# Patient Record
Sex: Female | Born: 1965 | Race: Black or African American | Hispanic: No | Marital: Single | Smoking: Current every day smoker
Health system: Southern US, Community
[De-identification: ages and names within clinical notes are randomized; demographics above are authoritative.]

## PROBLEM LIST (undated history)

## (undated) DIAGNOSIS — H409 Unspecified glaucoma: Secondary | ICD-10-CM

## (undated) DIAGNOSIS — M1990 Unspecified osteoarthritis, unspecified site: Secondary | ICD-10-CM

## (undated) DIAGNOSIS — T7840XA Allergy, unspecified, initial encounter: Secondary | ICD-10-CM

## (undated) DIAGNOSIS — Z973 Presence of spectacles and contact lenses: Secondary | ICD-10-CM

## (undated) HISTORY — DX: Allergy, unspecified, initial encounter: T78.40XA

## (undated) HISTORY — PX: BREAST EXCISIONAL BIOPSY: SUR124

## (undated) HISTORY — DX: Unspecified glaucoma: H40.9

## (undated) HISTORY — PX: DILATION AND CURETTAGE OF UTERUS: SHX78

## (undated) HISTORY — DX: Unspecified osteoarthritis, unspecified site: M19.90

---

## 1994-06-09 HISTORY — PX: CERVICAL DISC SURGERY: SHX588

## 1997-11-14 ENCOUNTER — Observation Stay (HOSPITAL_COMMUNITY): Admission: RE | Admit: 1997-11-14 | Discharge: 1997-11-15 | Payer: Self-pay | Admitting: Neurosurgery

## 1997-12-12 ENCOUNTER — Ambulatory Visit (HOSPITAL_COMMUNITY): Admission: RE | Admit: 1997-12-12 | Discharge: 1997-12-12 | Payer: Self-pay | Admitting: Neurosurgery

## 1998-01-02 ENCOUNTER — Ambulatory Visit (HOSPITAL_COMMUNITY): Admission: RE | Admit: 1998-01-02 | Discharge: 1998-01-02 | Payer: Self-pay | Admitting: *Deleted

## 1999-01-01 ENCOUNTER — Other Ambulatory Visit: Admission: RE | Admit: 1999-01-01 | Discharge: 1999-01-01 | Payer: Self-pay | Admitting: Obstetrics and Gynecology

## 2000-12-07 ENCOUNTER — Other Ambulatory Visit: Admission: RE | Admit: 2000-12-07 | Discharge: 2000-12-07 | Payer: Self-pay | Admitting: Obstetrics and Gynecology

## 2003-11-10 ENCOUNTER — Emergency Department (HOSPITAL_COMMUNITY): Admission: EM | Admit: 2003-11-10 | Discharge: 2003-11-10 | Payer: Self-pay | Admitting: Emergency Medicine

## 2006-01-15 ENCOUNTER — Other Ambulatory Visit: Admission: RE | Admit: 2006-01-15 | Discharge: 2006-01-15 | Payer: Self-pay | Admitting: *Deleted

## 2006-09-03 ENCOUNTER — Emergency Department (HOSPITAL_COMMUNITY): Admission: EM | Admit: 2006-09-03 | Discharge: 2006-09-03 | Payer: Self-pay | Admitting: Family Medicine

## 2007-05-10 ENCOUNTER — Other Ambulatory Visit: Admission: RE | Admit: 2007-05-10 | Discharge: 2007-05-10 | Payer: Self-pay | Admitting: *Deleted

## 2007-05-15 ENCOUNTER — Emergency Department (HOSPITAL_COMMUNITY): Admission: EM | Admit: 2007-05-15 | Discharge: 2007-05-15 | Payer: Self-pay | Admitting: Family Medicine

## 2008-05-30 ENCOUNTER — Other Ambulatory Visit: Admission: RE | Admit: 2008-05-30 | Discharge: 2008-05-30 | Payer: Self-pay | Admitting: Gynecology

## 2008-06-27 ENCOUNTER — Other Ambulatory Visit: Admission: RE | Admit: 2008-06-27 | Discharge: 2008-06-27 | Payer: Self-pay | Admitting: Gynecology

## 2011-05-19 ENCOUNTER — Encounter: Payer: Self-pay | Admitting: Emergency Medicine

## 2011-05-19 ENCOUNTER — Emergency Department (HOSPITAL_COMMUNITY)
Admission: EM | Admit: 2011-05-19 | Discharge: 2011-05-19 | Disposition: A | Discharge status: Home / Self Care | Payer: Self-pay | Source: Home / Self Care | Attending: Family Medicine | Admitting: Family Medicine

## 2011-05-19 DIAGNOSIS — J069 Acute upper respiratory infection, unspecified: Secondary | ICD-10-CM

## 2011-05-19 DIAGNOSIS — R05 Cough: Secondary | ICD-10-CM

## 2011-05-19 MED ORDER — GUAIFENESIN-CODEINE 100-10 MG/5ML PO SYRP: 5.0000 mL | ORAL_SOLUTION | Freq: Four times a day (QID) | ORAL | Status: AC | PRN

## 2011-05-19 MED ORDER — AZITHROMYCIN 250 MG PO TABS: 250.0000 mg | ORAL_TABLET | Freq: Every day | ORAL | Status: AC

## 2011-05-19 NOTE — ED Provider Notes (Signed)
History     CSN: [id number] Arrival date & time: 05/19/2011 11:19 AM   First MD Initiated Contact with Patient 05/19/11 1141      Chief Complaint  Patient presents with  . URI    (Consider location/radiation/quality/duration/timing/severity/associated sxs/prior treatment) HPI Comments: Rebecca Melendez presents for evaluation of fever, chills, cough, chest congestion, and nasal congestion. She reports her daughter as sick contact. She has tried over the counter products without relief. She smokes a pack of cigarettes daily.   Patient is a 45 y.o. female presenting with URI. The history is provided by the patient.  URI The primary symptoms include fever, sore throat and cough. The current episode started 2 days ago. This is a new problem. The problem has not changed since onset. The maximum temperature recorded prior to her arrival was unknown.  The sore throat began more than 2 days ago. The sore throat has been unchanged since its onset. The sore throat is moderate in intensity. The sore throat is not accompanied by trouble swallowing.  The onset of the illness is associated with exposure to sick contacts.    History reviewed. No pertinent past medical history.  History reviewed. No pertinent past surgical history.  History reviewed. No pertinent family history.  History  Substance Use Topics  . Smoking status: Current Everyday Smoker -- 1.0 packs/day  . Smokeless tobacco: Not on file  . Alcohol Use: Yes     socially    OB History    Grav Para Term Preterm Abortions TAB SAB Ect Mult Living                  Review of Systems  Constitutional: Positive for fever.  HENT: Positive for sore throat. Negative for trouble swallowing.   Eyes: Negative.   Respiratory: Positive for cough.   Cardiovascular: Negative.   Gastrointestinal: Negative.   Genitourinary: Negative.   Musculoskeletal: Negative.   Neurological: Negative.     Allergies  Review of patient's allergies indicates  no known allergies.  Home Medications   Current Outpatient Rx  Name Route Sig Dispense Refill  . ASPIRIN EFFERVESCENT 325 MG PO TBEF Oral Take 325 mg by mouth every 6 (six) hours as needed.      Marland Kitchen DEXTROMETHORPHAN HBR 15 MG/5ML PO SYRP Oral Take 10 mLs by mouth 4 (four) times daily as needed.      . AZITHROMYCIN 250 MG PO TABS Oral Take 1 tablet (250 mg total) by mouth daily. Take two tablets on first day, then one tablet each day for four days 6 tablet 0  . GUAIFENESIN-CODEINE 100-10 MG/5ML PO SYRP Oral Take 5 mLs by mouth every 6 (six) hours as needed for cough or congestion. 120 mL 0    BP 157/91  Pulse 112  Temp(Src) 99.8 F (37.7 C) (Oral)  Resp 18  SpO2 97%  LMP 05/05/2011  Physical Exam  Nursing note and vitals reviewed. Constitutional: She is oriented to person, place, and time. She appears well-developed and well-nourished.  HENT:  Head: Normocephalic and atraumatic.  Right Ear: Tympanic membrane and external ear normal.  Left Ear: Tympanic membrane and external ear normal.  Mouth/Throat: Oropharynx is clear and moist.  Eyes: EOM are normal.  Neck: Normal range of motion.  Pulmonary/Chest: Effort normal. She has no wheezes. She has no rales.  Musculoskeletal: Normal range of motion.  Neurological: She is alert and oriented to person, place, and time.  Skin: Skin is warm and dry.  Psychiatric: Her behavior is normal.  ED Course  Procedures (including critical care time)  Labs Reviewed - No data to display No results found.   1. URI (upper respiratory infection)   2. Cough       MDM          Richardo Priest, MD 05/19/11 1304

## 2011-05-19 NOTE — ED Notes (Signed)
Yesterday pt started coughing white mucus, chills, body aches, diarrhea, nausea.

## 2011-05-22 ENCOUNTER — Telehealth (HOSPITAL_COMMUNITY): Payer: Self-pay | Admitting: *Deleted

## 2012-01-27 ENCOUNTER — Ambulatory Visit (INDEPENDENT_AMBULATORY_CARE_PROVIDER_SITE_OTHER): Payer: BC Managed Care – PPO | Admitting: Family Medicine

## 2012-01-27 VITALS — BP 116/88 | HR 69 | Resp 16 | Ht 61.58 in | Wt 123.6 lb

## 2012-01-27 DIAGNOSIS — H1045 Other chronic allergic conjunctivitis: Secondary | ICD-10-CM

## 2012-01-27 DIAGNOSIS — H109 Unspecified conjunctivitis: Secondary | ICD-10-CM

## 2012-01-27 MED ORDER — POLYMYXIN B-TRIMETHOPRIM 10000-0.1 UNIT/ML-% OP SOLN: 1.0000 [drp] | OPHTHALMIC | Status: AC

## 2012-01-27 NOTE — Progress Notes (Signed)
  Subjective:    Patient ID: Rebecca Melendez, female    DOB: 1966-02-08, 46 y.o.   MRN: [id number]  HPI Rebecca Melendez is a 46 y.o. female Redness itching and swelling in corner of L eye.  Just a little bit in r eye.  Tried visine otc.  No relief. No fever.  No congestion, runny nose. No known sick contacts.    Review of Systems  HENT: Negative for congestion and rhinorrhea.   Eyes: Positive for pain (sore in corner of eye. ), discharge and redness. Negative for visual disturbance.  Respiratory: Negative for cough and shortness of breath.        Objective:   Physical Exam  Constitutional: She appears well-developed and well-nourished.  HENT:  Head: Normocephalic and atraumatic.  Right Ear: External ear normal.  Left Ear: External ear normal.  Mouth/Throat: Oropharynx is clear and moist.  Eyes: EOM are normal. Pupils are equal, round, and reactive to light.         Slight erythema inner L eye conjunctiva.  No exudate. R eye wnl.  Skin: Skin is warm and dry. No rash noted.  Psychiatric: She has a normal mood and affect. Her behavior is normal.          Assessment & Plan:

## 2012-01-27 NOTE — Patient Instructions (Addendum)
You can use over the counter refresh eye drops if needed.  Start the antibiotic drops and if not better in 4 to 5 days - recheck.  Ok to use these in the right eye if it also turns red.  Return to the clinic or go to the nearest emergency room if any of your symptoms worsen or new symptoms occur.    Conjunctivitis Conjunctivitis is commonly called "pink eye." Conjunctivitis can be caused by bacterial or viral infection, allergies, or injuries. There is usually redness of the lining of the eye, itching, discomfort, and sometimes discharge. There may be deposits of matter along the eyelids. A viral infection usually causes a watery discharge, while a bacterial infection causes a yellowish, thick discharge. Pink eye is very contagious and spreads by direct contact. You may be given antibiotic eyedrops as part of your treatment. Before using your eye medicine, remove all drainage from the eye by washing gently with warm water and cotton balls. Continue to use the medication until you have awakened 2 mornings in a row without discharge from the eye. Do not rub your eye. This increases the irritation and helps spread infection. Use separate towels from other household members. Wash your hands with soap and water before and after touching your eyes. Use cold compresses to reduce pain and sunglasses to relieve irritation from light. Do not wear contact lenses or wear eye makeup until the infection is gone. SEEK MEDICAL CARE IF:   Your symptoms are not better after 3 days of treatment.   You have increased pain or trouble seeing.   The outer eyelids become very red or swollen.  Document Released: 07/03/2004 Document Revised: 05/15/2011 Document Reviewed: 05/26/2005 [facility] Patient Information 2012 [place], [place].

## 2013-01-26 ENCOUNTER — Other Ambulatory Visit: Payer: Self-pay | Admitting: Gynecology

## 2013-01-26 DIAGNOSIS — R928 Other abnormal and inconclusive findings on diagnostic imaging of breast: Secondary | ICD-10-CM

## 2013-02-09 ENCOUNTER — Ambulatory Visit
Admission: RE | Admit: 2013-02-09 | Discharge: 2013-02-09 | Disposition: A | Discharge status: Home / Self Care | Payer: BC Managed Care – PPO | Source: Ambulatory Visit | Attending: Gynecology | Admitting: Gynecology

## 2013-02-09 DIAGNOSIS — R928 Other abnormal and inconclusive findings on diagnostic imaging of breast: Secondary | ICD-10-CM

## 2013-02-16 ENCOUNTER — Other Ambulatory Visit: Payer: BC Managed Care – PPO

## 2013-08-15 ENCOUNTER — Encounter (HOSPITAL_COMMUNITY): Payer: Self-pay | Admitting: Emergency Medicine

## 2013-08-15 ENCOUNTER — Emergency Department (HOSPITAL_COMMUNITY)
Admission: EM | Admit: 2013-08-15 | Discharge: 2013-08-15 | Disposition: A | Discharge status: Home / Self Care | Payer: BC Managed Care – PPO | Source: Home / Self Care | Attending: Family Medicine | Admitting: Family Medicine

## 2013-08-15 DIAGNOSIS — H109 Unspecified conjunctivitis: Secondary | ICD-10-CM

## 2013-08-15 DIAGNOSIS — D1739 Benign lipomatous neoplasm of skin and subcutaneous tissue of other sites: Secondary | ICD-10-CM

## 2013-08-15 DIAGNOSIS — D1720 Benign lipomatous neoplasm of skin and subcutaneous tissue of unspecified limb: Secondary | ICD-10-CM

## 2013-08-15 MED ORDER — POLYMYXIN B-TRIMETHOPRIM 10000-0.1 UNIT/ML-% OP SOLN: 1.0000 [drp] | Freq: Four times a day (QID) | OPHTHALMIC | Status: DC

## 2013-08-15 NOTE — ED Provider Notes (Signed)
Medical screening examination/treatment/procedure(s) were performed by resident physician or non-physician practitioner and as supervising physician I was immediately available for consultation/collaboration.   Treyson Axel DOUGLAS MD.   Aslee Such D Eulamae Greenstein, MD 08/15/13 2056 

## 2013-08-15 NOTE — ED Notes (Signed)
C/o  Abscess, right axillary.  Tearing, itching, and pain in the corner of the left eye.   Symptoms present x 2 wks.   Pt has tried sinus meds with no relief.   Denies fever and any other symptoms.

## 2013-08-15 NOTE — Discharge Instructions (Signed)
Conjunctivitis Conjunctivitis is commonly called "pink eye." Conjunctivitis can be caused by bacterial or viral infection, allergies, or injuries. There is usually redness of the lining of the eye, itching, discomfort, and sometimes discharge. There may be deposits of matter along the eyelids. A viral infection usually causes a watery discharge, while a bacterial infection causes a yellowish, thick discharge. Pink eye is very contagious and spreads by direct contact. You may be given antibiotic eyedrops as part of your treatment. Before using your eye medicine, remove all drainage from the eye by washing gently with warm water and cotton balls. Continue to use the medication until you have awakened 2 mornings in a row without discharge from the eye. Do not rub your eye. This increases the irritation and helps spread infection. Use separate towels from other household members. Wash your hands with soap and water before and after touching your eyes. Use cold compresses to reduce pain and sunglasses to relieve irritation from light. Do not wear contact lenses or wear eye makeup until the infection is gone. SEEK MEDICAL CARE IF:   Your symptoms are not better after 3 days of treatment.  You have increased pain or trouble seeing.  The outer eyelids become very red or swollen. Document Released: 07/03/2004 Document Revised: 08/18/2011 Document Reviewed: 05/26/2005 [facility] Patient Information 2014 [place].  Lipoma A lipoma is a noncancerous (benign) tumor composed of fat cells. They are usually found under the skin (subcutaneous). A lipoma may occur in any tissue of the body that contains fat. Common areas for lipomas to appear include the back, shoulders, buttocks, and thighs. Lipomas are a very common soft tissue growth. They are soft and grow slowly. Most problems caused by a lipoma depend on where it is growing. DIAGNOSIS  A lipoma can be diagnosed with a physical exam. These tumors rarely  become cancerous, but radiographic studies can help determine this for certain. Studies used may include:  Computerized X-ray scans (CT or CAT scan).  Computerized magnetic scans (MRI). TREATMENT  Small lipomas that are not causing problems may be watched. If a lipoma continues to enlarge or causes problems, removal is often the best treatment. Lipomas can also be removed to improve appearance. Surgery is done to remove the fatty cells and the surrounding capsule. Most often, this is done with medicine that numbs the area (local anesthetic). The removed tissue is examined under a microscope to make sure it is not cancerous. Keep all follow-up appointments with your caregiver. SEEK MEDICAL CARE IF:   The lipoma becomes larger or hard.  The lipoma becomes painful, red, or increasingly swollen. These could be signs of infection or a more serious condition. Document Released: 05/16/2002 Document Revised: 08/18/2011 Document Reviewed: 10/26/2009 [facility] Patient Information 2014 [place], [place].  Please apply warm compresses to left eye four times a day and use eye drops as directed. If symptoms do not improve, please follow up with either opthomologist listed on your discharge paperwork or the opthomologist of your choice.

## 2013-08-15 NOTE — ED Provider Notes (Signed)
CSN: [id number]     Arrival date & time 08/15/13  1609 History   First MD Initiated Contact with Patient 08/15/13 1741     Chief Complaint  Patient presents with  . Abscess  . Eye Problem   (Consider location/radiation/quality/duration/timing/severity/associated sxs/prior Treatment) HPI Comments: Patient is here with two issues. First she reports that she has had a recurrent "cyst" at right axilla over past several years. Current cyst has been present for quite sometime and she wishes to have it removed. 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 She now reports she has had 2-3 weeks of her left eye watering, clear tears. States that on occasion in the mornings she has matted material at medial corner of left eye. Denies eye pain. States medial corner of left eye is a bit sore from constantly dabbing away the tears. No changes in vision. No contact lens use. No eye redness.    Patient is a 48 y.o. female presenting with abscess and eye problem.  Abscess Eye Problem   History reviewed. No pertinent past medical history. History reviewed. No pertinent past surgical history. History reviewed. No pertinent family history. History  Substance Use Topics  . Smoking status: Current Every Day Smoker -- 1.00 packs/day  . Smokeless tobacco: Not on file  . Alcohol Use: Yes     Comment: socially   OB History   Grav Para Term Preterm Abortions TAB SAB Ect Mult Living                 Review of Systems  All other systems reviewed and are negative.    Allergies  Review of patient's allergies indicates no known allergies.  Home Medications   Current Outpatient Rx  Name  Route  Sig  Dispense  Refill  . trimethoprim-polymyxin b (POLYTRIM) ophthalmic solution   Left Eye   Place 1 drop into the left eye every 6 (six) hours. X 7 days   10 mL   0    BP 145/86  Pulse 86  Temp(Src) 97.9 F (36.6  C) (Oral)  Resp 16  SpO2 100%  LMP 08/12/2013 Physical Exam  Nursing note and vitals reviewed. Constitutional: She is oriented to person, place, and time. She appears well-developed and well-nourished. No distress.  HENT:  Head: Normocephalic and atraumatic.  Right Ear: External ear normal.  Left Ear: External ear normal.  Nose: Nose normal.  Eyes: Conjunctivae, EOM and lids are normal. Pupils are equal, round, and reactive to light. Right eye exhibits no discharge and no exudate. No foreign body present in the right eye. Left eye exhibits no discharge and no exudate. No foreign body present in the left eye.  Slit lamp exam:      The right eye shows no fluorescein uptake.       The left eye shows no fluorescein uptake.  +clear tears accumulate at medial corner of left eye persistently  Cardiovascular: Normal rate.   Pulmonary/Chest: Effort normal.  Musculoskeletal: Normal range of motion.  Neurological: She is alert and oriented to person, place, and time.  Skin: Skin is warm and dry.  + 1 cm x 2 cm soft lipoma at right axilla. No skin erythema, induration or tenderness.   Psychiatric: She has a normal mood and affect. Her behavior is normal.    ED Course  Procedures (including critical care time) Labs Review Labs Reviewed - No data to display Imaging  Review No results found.   MDM   1. Lipoma of axilla   2. Conjunctivitis    Lipoma right axilla: will refer to [facility] for removal. Left eye likely with obstructed tear duct. Will try short course of polytrim opthalmic and warm compresses to treat possible early conjunctivitis and refer to opthomology if no improvement. Will also provide referral for PCP establishment.   [place], [place] 08/15/13 [phone]

## 2013-08-23 ENCOUNTER — Encounter (INDEPENDENT_AMBULATORY_CARE_PROVIDER_SITE_OTHER): Payer: Self-pay | Admitting: Surgery

## 2013-08-23 ENCOUNTER — Ambulatory Visit (INDEPENDENT_AMBULATORY_CARE_PROVIDER_SITE_OTHER): Payer: PRIVATE HEALTH INSURANCE | Admitting: Surgery

## 2013-08-23 VITALS — BP 122/76 | HR 72 | Temp 97.1°F | Resp 14 | Ht 61.0 in | Wt 132.0 lb

## 2013-08-23 DIAGNOSIS — L723 Sebaceous cyst: Secondary | ICD-10-CM | POA: Insufficient documentation

## 2013-08-23 NOTE — Progress Notes (Signed)
Patient ID: Rebecca Melendez, female   DOB: November 13, 1965, 48 y.o.   MRN: [id number]  Chief Complaint  Patient presents with  . Mass    HPI SIA Rebecca Melendez is a 48 y.o. female.  Karin Lieu - PA-C for evaluation of right axillary mass  HPI This is a 48 year old female who presents with recurrent mass under her right axilla.  This has been present for several years. She has been able to express some whitish drainage from this area in the past. However this time it has become larger and slightly uncomfortable. There is no sign of infection. She presents now to discuss excision.   History reviewed. No pertinent past medical history.  History reviewed. No pertinent past surgical history.  History reviewed. No pertinent family history.  Social History History  Substance Use Topics  . Smoking status: Current Every Day Smoker -- 1.00 packs/day    Types: Cigarettes  . Smokeless tobacco: Not on file  . Alcohol Use: Yes     Comment: socially    No Known Allergies  No current outpatient prescriptions on file.   No current facility-administered medications for this visit.    Review of Systems Review of Systems  Constitutional: Negative for fever, chills and unexpected weight change.  HENT: Negative for congestion, hearing loss, sore throat, trouble swallowing and voice change.   Eyes: Negative for visual disturbance.  Respiratory: Negative for cough and wheezing.   Cardiovascular: Negative for chest pain, palpitations and leg swelling.  Gastrointestinal: Negative for nausea, vomiting, abdominal pain, diarrhea, constipation, blood in stool, abdominal distention and anal bleeding.  Genitourinary: Negative for hematuria, vaginal bleeding and difficulty urinating.  Musculoskeletal: Negative for arthralgias.  Skin: Negative for rash and wound.  Neurological: Negative for seizures, syncope and headaches.  Hematological: Negative for adenopathy. Does not bruise/bleed easily.   Psychiatric/Behavioral: Negative for confusion.    Blood pressure 122/76, pulse 72, temperature 97.1 F (36.2 C), temperature source Temporal, resp. rate 14, height 5\' 1"  (1.549 m), weight 132 lb (59.875 kg), last menstrual period 08/12/2013.  Physical Exam Physical Exam WDWN in NAD Right axilla-3 cm protruding subcutaneous mass with no overlying erythema or induration. No fluctuance noted. This mass is well demarcated.   Data Reviewed none  Assessment    Sebaceous cyst right axilla 3 cm with no sign of active infection     Plan    Excision of sebaceous cyst right axilla under anesthesia.The surgical procedure has been discussed with the patient.  Potential risks, benefits, alternative treatments, and expected outcomes have been explained.  All of the patient's questions at this time have been answered.  The likelihood of reaching the patient's treatment goal is good.  The patient understand the proposed surgical procedure and wishes to proceed.         Tyrees Chopin K. 08/23/2013, 3:37 PM

## 2013-09-06 ENCOUNTER — Encounter (HOSPITAL_BASED_OUTPATIENT_CLINIC_OR_DEPARTMENT_OTHER): Payer: Self-pay | Admitting: *Deleted

## 2013-09-06 NOTE — Progress Notes (Signed)
No labs needed

## 2013-09-08 ENCOUNTER — Encounter (HOSPITAL_BASED_OUTPATIENT_CLINIC_OR_DEPARTMENT_OTHER)
Admission: RE | Disposition: A | Discharge status: Home / Self Care | Payer: Self-pay | Source: Ambulatory Visit | Attending: Surgery

## 2013-09-08 ENCOUNTER — Ambulatory Visit (HOSPITAL_BASED_OUTPATIENT_CLINIC_OR_DEPARTMENT_OTHER)
Admission: RE | Admit: 2013-09-08 | Discharge: 2013-09-08 | Disposition: A | Discharge status: Home / Self Care | Payer: BC Managed Care – PPO | Source: Ambulatory Visit | Attending: Surgery | Admitting: Surgery

## 2013-09-08 ENCOUNTER — Ambulatory Visit (HOSPITAL_BASED_OUTPATIENT_CLINIC_OR_DEPARTMENT_OTHER): Payer: BC Managed Care – PPO | Admitting: Anesthesiology

## 2013-09-08 ENCOUNTER — Encounter (HOSPITAL_BASED_OUTPATIENT_CLINIC_OR_DEPARTMENT_OTHER): Payer: Self-pay | Admitting: Anesthesiology

## 2013-09-08 ENCOUNTER — Encounter (HOSPITAL_BASED_OUTPATIENT_CLINIC_OR_DEPARTMENT_OTHER): Payer: BC Managed Care – PPO | Admitting: Anesthesiology

## 2013-09-08 DIAGNOSIS — F17200 Nicotine dependence, unspecified, uncomplicated: Secondary | ICD-10-CM | POA: Insufficient documentation

## 2013-09-08 DIAGNOSIS — L723 Sebaceous cyst: Secondary | ICD-10-CM | POA: Insufficient documentation

## 2013-09-08 HISTORY — PX: MASS EXCISION: SHX2000

## 2013-09-08 HISTORY — DX: Presence of spectacles and contact lenses: Z97.3

## 2013-09-08 LAB — POCT HEMOGLOBIN-HEMACUE: Hemoglobin: 14 g/dL (ref 12.0–15.0)

## 2013-09-08 SURGERY — EXCISION MASS
Anesthesia: General | Site: Axilla | Laterality: Right

## 2013-09-08 MED ORDER — OXYCODONE HCL 5 MG/5ML PO SOLN: 5.0000 mg | Freq: Once | ORAL | Status: DC | PRN

## 2013-09-08 MED ORDER — BUPIVACAINE-EPINEPHRINE 0.25% -1:200000 IJ SOLN
INTRAMUSCULAR | Status: DC | PRN
Start: 2013-09-08 — End: 2013-09-08
  Administered 2013-09-08: 9 mL

## 2013-09-08 MED ORDER — BUPIVACAINE-EPINEPHRINE PF 0.5-1:200000 % IJ SOLN
INTRAMUSCULAR | Status: AC
  Filled 2013-09-08: qty 30

## 2013-09-08 MED ORDER — DEXAMETHASONE SODIUM PHOSPHATE 4 MG/ML IJ SOLN
INTRAMUSCULAR | Status: DC | PRN
  Administered 2013-09-08: 10 mg via INTRAVENOUS

## 2013-09-08 MED ORDER — FENTANYL CITRATE 0.05 MG/ML IJ SOLN
INTRAMUSCULAR | Status: DC | PRN
  Administered 2013-09-08: 100 ug via INTRAVENOUS

## 2013-09-08 MED ORDER — LACTATED RINGERS IV SOLN
INTRAVENOUS | Status: DC
  Administered 2013-09-08 (×2): via INTRAVENOUS

## 2013-09-08 MED ORDER — CEFAZOLIN SODIUM-DEXTROSE 2-3 GM-% IV SOLR
INTRAVENOUS | Status: AC
  Filled 2013-09-08: qty 50

## 2013-09-08 MED ORDER — MIDAZOLAM HCL 2 MG/2ML IJ SOLN: 1.0000 mg | INTRAMUSCULAR | Status: DC | PRN

## 2013-09-08 MED ORDER — OXYCODONE HCL 5 MG PO TABS: 5.0000 mg | ORAL_TABLET | Freq: Once | ORAL | Status: DC | PRN

## 2013-09-08 MED ORDER — METOCLOPRAMIDE HCL 5 MG/ML IJ SOLN: 10.0000 mg | Freq: Once | INTRAMUSCULAR | Status: DC | PRN

## 2013-09-08 MED ORDER — ONDANSETRON HCL 4 MG/2ML IJ SOLN
INTRAMUSCULAR | Status: DC | PRN
  Administered 2013-09-08: 4 mg via INTRAVENOUS

## 2013-09-08 MED ORDER — CEFAZOLIN SODIUM-DEXTROSE 2-3 GM-% IV SOLR
2.0000 g | INTRAVENOUS | Status: AC
  Administered 2013-09-08: 2 g via INTRAVENOUS

## 2013-09-08 MED ORDER — PROPOFOL 10 MG/ML IV BOLUS
INTRAVENOUS | Status: DC | PRN
  Administered 2013-09-08: 200 mg via INTRAVENOUS

## 2013-09-08 MED ORDER — FENTANYL CITRATE 0.05 MG/ML IJ SOLN: 50.0000 ug | INTRAMUSCULAR | Status: DC | PRN

## 2013-09-08 MED ORDER — MIDAZOLAM HCL 5 MG/5ML IJ SOLN
INTRAMUSCULAR | Status: DC | PRN
  Administered 2013-09-08: 2 mg via INTRAVENOUS

## 2013-09-08 MED ORDER — FENTANYL CITRATE 0.05 MG/ML IJ SOLN
INTRAMUSCULAR | Status: AC
  Filled 2013-09-08: qty 6

## 2013-09-08 MED ORDER — MIDAZOLAM HCL 2 MG/2ML IJ SOLN
INTRAMUSCULAR | Status: AC
  Filled 2013-09-08: qty 2

## 2013-09-08 MED ORDER — HYDROCODONE-ACETAMINOPHEN 5-325 MG PO TABS: 1.0000 | ORAL_TABLET | ORAL | Status: DC | PRN

## 2013-09-08 MED ORDER — CHLORHEXIDINE GLUCONATE 4 % EX LIQD
1.0000 | Freq: Once | CUTANEOUS | Status: DC
Start: 2013-09-09 — End: 2013-09-08

## 2013-09-08 MED ORDER — METOCLOPRAMIDE HCL 5 MG/ML IJ SOLN
INTRAMUSCULAR | Status: DC | PRN
  Administered 2013-09-08: 20 mg via INTRAVENOUS

## 2013-09-08 MED ORDER — HYDROMORPHONE HCL PF 1 MG/ML IJ SOLN: 0.2500 mg | INTRAMUSCULAR | Status: DC | PRN

## 2013-09-08 MED ORDER — BUPIVACAINE-EPINEPHRINE PF 0.25-1:200000 % IJ SOLN
INTRAMUSCULAR | Status: AC
  Filled 2013-09-08: qty 30

## 2013-09-08 MED ORDER — LIDOCAINE HCL (CARDIAC) 20 MG/ML IV SOLN
INTRAVENOUS | Status: DC | PRN
  Administered 2013-09-08: 100 mg via INTRAVENOUS

## 2013-09-08 SURGICAL SUPPLY — 47 items
APL SKNCLS STERI-STRIP NONHPOA (GAUZE/BANDAGES/DRESSINGS) ×1
BENZOIN TINCTURE PRP APPL 2/3 (GAUZE/BANDAGES/DRESSINGS) ×3 IMPLANT
BLADE SURG 15 STRL LF DISP TIS (BLADE) ×1 IMPLANT
BLADE SURG 15 STRL SS (BLADE) ×3
BLADE SURG ROTATE 9660 (MISCELLANEOUS) ×2 IMPLANT
CANISTER SUCT 1200ML W/VALVE (MISCELLANEOUS) IMPLANT
CHLORAPREP W/TINT 26ML (MISCELLANEOUS) ×3 IMPLANT
CLOSURE WOUND 1/2 X4 (GAUZE/BANDAGES/DRESSINGS) ×1
COVER MAYO STAND STRL (DRAPES) ×3 IMPLANT
COVER TABLE BACK 60X90 (DRAPES) ×3 IMPLANT
DECANTER SPIKE VIAL GLASS SM (MISCELLANEOUS) IMPLANT
DRAPE PED LAPAROTOMY (DRAPES) ×3 IMPLANT
DRAPE UTILITY XL STRL (DRAPES) ×3 IMPLANT
DRSG TEGADERM 2-3/8X2-3/4 SM (GAUZE/BANDAGES/DRESSINGS) ×2 IMPLANT
DRSG TEGADERM 4X4.75 (GAUZE/BANDAGES/DRESSINGS) ×2 IMPLANT
ELECT COATED BLADE 2.86 ST (ELECTRODE) ×3 IMPLANT
ELECT REM PT RETURN 9FT ADLT (ELECTROSURGICAL) ×3
ELECTRODE REM PT RTRN 9FT ADLT (ELECTROSURGICAL) ×1 IMPLANT
GLOVE BIO SURGEON STRL SZ7 (GLOVE) ×3 IMPLANT
GLOVE BIO SURGEON STRL SZ7.5 (GLOVE) ×2 IMPLANT
GLOVE BIOGEL PI IND STRL 7.5 (GLOVE) ×1 IMPLANT
GLOVE BIOGEL PI IND STRL 8 (GLOVE) IMPLANT
GLOVE BIOGEL PI INDICATOR 7.5 (GLOVE) ×2
GLOVE BIOGEL PI INDICATOR 8 (GLOVE) ×2
GOWN STRL REUS W/ TWL LRG LVL3 (GOWN DISPOSABLE) ×1 IMPLANT
GOWN STRL REUS W/ TWL XL LVL3 (GOWN DISPOSABLE) IMPLANT
GOWN STRL REUS W/TWL LRG LVL3 (GOWN DISPOSABLE) ×3
GOWN STRL REUS W/TWL XL LVL3 (GOWN DISPOSABLE) ×3
NDL HYPO 25X1 1.5 SAFETY (NEEDLE) ×1 IMPLANT
NEEDLE HYPO 25X1 1.5 SAFETY (NEEDLE) ×3 IMPLANT
NS IRRIG 1000ML POUR BTL (IV SOLUTION) IMPLANT
PACK BASIN DAY SURGERY FS (CUSTOM PROCEDURE TRAY) ×3 IMPLANT
PENCIL BUTTON HOLSTER BLD 10FT (ELECTRODE) ×3 IMPLANT
SPONGE GAUZE 4X4 12PLY STER LF (GAUZE/BANDAGES/DRESSINGS) IMPLANT
STRIP CLOSURE SKIN 1/2X4 (GAUZE/BANDAGES/DRESSINGS) ×2 IMPLANT
SUT MON AB 4-0 PC3 18 (SUTURE) ×2 IMPLANT
SUT PROLENE 6 0 P 1 18 (SUTURE) IMPLANT
SUT SILK 2 0 FS (SUTURE) IMPLANT
SUT VIC AB 3-0 SH 27 (SUTURE) ×3
SUT VIC AB 3-0 SH 27X BRD (SUTURE) IMPLANT
SUT VICRYL 3-0 CR8 SH (SUTURE) IMPLANT
SYR CONTROL 10ML LL (SYRINGE) ×3 IMPLANT
TOWEL OR 17X24 6PK STRL BLUE (TOWEL DISPOSABLE) ×3 IMPLANT
TOWEL OR NON WOVEN STRL DISP B (DISPOSABLE) ×3 IMPLANT
TUBE CONNECTING 20'X1/4 (TUBING)
TUBE CONNECTING 20X1/4 (TUBING) IMPLANT
YANKAUER SUCT BULB TIP NO VENT (SUCTIONS) IMPLANT

## 2013-09-08 NOTE — Anesthesia Preprocedure Evaluation (Signed)
Anesthesia Evaluation  Patient identified by MRN, date of birth, ID band Patient awake    Reviewed: Allergy & Precautions, H&P , NPO status , Patient's Chart, lab work & pertinent test results, reviewed documented beta blocker date and time   Airway Mallampati: II TM Distance: >3 FB Neck ROM: full    Dental   Pulmonary Current Smoker,  breath sounds clear to auscultation        Cardiovascular negative cardio ROS  Rhythm:regular     Neuro/Psych negative neurological ROS  negative psych ROS   GI/Hepatic negative GI ROS, Neg liver ROS,   Endo/Other  negative endocrine ROS  Renal/GU negative Renal ROS  negative genitourinary   Musculoskeletal   Abdominal   Peds  Hematology negative hematology ROS (+)   Anesthesia Other Findings See surgeon's H&P   Reproductive/Obstetrics negative OB ROS                           Anesthesia Physical Anesthesia Plan  ASA: II  Anesthesia Plan: General   Post-op Pain Management:    Induction: Intravenous  Airway Management Planned: LMA  Additional Equipment:   Intra-op Plan:   Post-operative Plan:   Informed Consent: I have reviewed the patients History and Physical, chart, labs and discussed the procedure including the risks, benefits and alternatives for the proposed anesthesia with the patient or authorized representative who has indicated his/her understanding and acceptance.   Dental Advisory Given  Plan Discussed with: CRNA and Surgeon  Anesthesia Plan Comments:         Anesthesia Quick Evaluation

## 2013-09-08 NOTE — Op Note (Signed)
Preop diagnosis: Ruptured sebaceous cyst of the right axilla (3 cm) Postop diagnosis:Same Procedure performed: Excision of sebaceous cyst of the right axilla Surgeon:Nagee Goates K. Anesthesia: Gen. Via LMA Indications: This is a 48 year old female who presents with a large sebaceous cyst of the right axilla. This has drained several times. Due to its persistence and recurrence she presents now for excision under anesthesia.  The patient brought to the operating room and placed in supine position on the operating room table. After an adequate level of general anesthesia was obtained, her right axilla was shaved, prepped with chlor prep and draped sterile fashion. A timeout was taken to ensure the proper patient proper procedure. We infiltrated the area around the mass with 0.25% Marcaine with epinephrine. I made an elliptical incision over the mass to excise some of the old scar. We dissected down around the cyst. The cyst appears to have ruptured in the subcutaneous tissue. We excised all of the visible cyst back to healthy appearing tissue. We irrigated the wound and inspected for hemostasis. The wound was closed with a deep layer of 3-0 Vicryl and a subcuticular layer of 4-0 Monocryl. Steri-Strips and clean dressings were applied. The patient was then extubated and brought to recovery in stable condition. All sponge, instrument, and needle counts are correct.  Imogene Burn. Georgette Dover, MD, [facility]  General/ Trauma Surgery  09/08/2013 2:21 PM

## 2013-09-08 NOTE — Anesthesia Postprocedure Evaluation (Signed)
Anesthesia Post Note  Patient: Rebecca Melendez  Procedure(s) Performed: Procedure(s) (LRB): EXCISION OF SEBACEOUS CYST RIGHT AXILLA (Right)  Anesthesia type: General  Patient location: PACU  Post pain: Pain level controlled  Post assessment: Patient's Cardiovascular Status Stable  Last Vitals:  Filed Vitals:   09/08/13 1445  BP: 121/90  Pulse: 74  Temp:   Resp: 16    Post vital signs: Reviewed and stable  Level of consciousness: alert  Complications: No apparent anesthesia complications

## 2013-09-08 NOTE — H&P (View-Only) (Signed)
Patient ID: Rebecca Melendez, female   DOB: 05-Feb-1966, 48 y.o.   MRN: [id number]  Chief Complaint  Patient presents with  . Mass    HPI Rebecca Melendez is a 48 y.o. female.  Karin Lieu - PA-C for evaluation of right axillary mass  HPI This is a 48 year old female who presents with recurrent mass under her right axilla.  This has been present for several years. She has been able to express some whitish drainage from this area in the past. However this time it has become larger and slightly uncomfortable. There is no sign of infection. She presents now to discuss excision.   History reviewed. No pertinent past medical history.  History reviewed. No pertinent past surgical history.  History reviewed. No pertinent family history.  Social History History  Substance Use Topics  . Smoking status: Current Every Day Smoker -- 1.00 packs/day    Types: Cigarettes  . Smokeless tobacco: Not on file  . Alcohol Use: Yes     Comment: socially    No Known Allergies  No current outpatient prescriptions on file.   No current facility-administered medications for this visit.    Review of Systems Review of Systems  Constitutional: Negative for fever, chills and unexpected weight change.  HENT: Negative for congestion, hearing loss, sore throat, trouble swallowing and voice change.   Eyes: Negative for visual disturbance.  Respiratory: Negative for cough and wheezing.   Cardiovascular: Negative for chest pain, palpitations and leg swelling.  Gastrointestinal: Negative for nausea, vomiting, abdominal pain, diarrhea, constipation, blood in stool, abdominal distention and anal bleeding.  Genitourinary: Negative for hematuria, vaginal bleeding and difficulty urinating.  Musculoskeletal: Negative for arthralgias.  Skin: Negative for rash and wound.  Neurological: Negative for seizures, syncope and headaches.  Hematological: Negative for adenopathy. Does not bruise/bleed easily.   Psychiatric/Behavioral: Negative for confusion.    Blood pressure 122/76, pulse 72, temperature 97.1 F (36.2 C), temperature source Temporal, resp. rate 14, height 5\' 1"  (1.549 m), weight 132 lb (59.875 kg), last menstrual period 08/12/2013.  Physical Exam Physical Exam WDWN in NAD Right axilla-3 cm protruding subcutaneous mass with no overlying erythema or induration. No fluctuance noted. This mass is well demarcated.   Data Reviewed none  Assessment    Sebaceous cyst right axilla 3 cm with no sign of active infection     Plan    Excision of sebaceous cyst right axilla under anesthesia.The surgical procedure has been discussed with the patient.  Potential risks, benefits, alternative treatments, and expected outcomes have been explained.  All of the patient's questions at this time have been answered.  The likelihood of reaching the patient's treatment goal is good.  The patient understand the proposed surgical procedure and wishes to proceed.         Kathleene Bergemann K. 08/23/2013, 3:37 PM

## 2013-09-08 NOTE — Interval H&P Note (Signed)
History and Physical Interval Note:  09/08/2013 1:04 PM  Rebecca Melendez  has presented today for surgery, with the diagnosis of sebaceous cyst right axilla  The various methods of treatment have been discussed with the patient and family. After consideration of risks, benefits and other options for treatment, the patient has consented to  Procedure(s): EXCISION OF SEBACEOUS CYST RIGHT AXILLA (Right) as a surgical intervention .  The patient's history has been reviewed, patient examined, no change in status, stable for surgery.  I have reviewed the patient's chart and labs.  Questions were answered to the patient's satisfaction.     [name] K.

## 2013-09-08 NOTE — Anesthesia Procedure Notes (Signed)
Procedure Name: LMA Insertion Date/Time: 09/08/2013 1:44 PM Performed by: Lieutenant Diego Pre-anesthesia Checklist: Patient identified, Emergency Drugs available, Suction available and Patient being monitored Patient Re-evaluated:Patient Re-evaluated prior to inductionOxygen Delivery Method: Circle System Utilized Preoxygenation: Pre-oxygenation with 100% oxygen Intubation Type: IV induction Ventilation: Mask ventilation without difficulty LMA: LMA inserted LMA Size: 3.0 Number of attempts: 1 Airway Equipment and Method: bite block Placement Confirmation: positive ETCO2 and breath sounds checked- equal and bilateral Tube secured with: Tape Dental Injury: Teeth and Oropharynx as per pre-operative assessment

## 2013-09-08 NOTE — Discharge Instructions (Signed)
Remove the dressing and shower over the steri-strips in 48 hours. Use the pain medicine and ice pack as needed.   Post Anesthesia Home Care Instructions  Activity: Get plenty of rest for the remainder of the day. A responsible adult should stay with you for 24 hours following the procedure.  For the next 24 hours, DO NOT: -Drive a car -Paediatric nurse -Drink alcoholic beverages -Take any medication unless instructed by your physician -Make any legal decisions or sign important papers.  Meals: Start with liquid foods such as gelatin or soup. Progress to regular foods as tolerated. Avoid greasy, spicy, heavy foods. If nausea and/or vomiting occur, drink only clear liquids until the nausea and/or vomiting subsides. Call your physician if vomiting continues.  Special Instructions/Symptoms: Your throat may feel dry or sore from the anesthesia or the breathing tube placed in your throat during surgery. If this causes discomfort, gargle with warm salt water. The discomfort should disappear within 24 hours.

## 2013-09-08 NOTE — Transfer of Care (Signed)
Immediate Anesthesia Transfer of Care Note  Patient: Rebecca Melendez  Procedure(s) Performed: Procedure(s): EXCISION OF SEBACEOUS CYST RIGHT AXILLA (Right)  Patient Location: PACU  Anesthesia Type:General  Level of Consciousness: awake and alert   Airway & Oxygen Therapy: Patient Spontanous Breathing and Patient connected to face mask oxygen  Post-op Assessment: Report given to PACU RN and Post -op Vital signs reviewed and stable  Post vital signs: Reviewed and stable  Complications: No apparent anesthesia complications

## 2013-09-13 ENCOUNTER — Encounter (HOSPITAL_BASED_OUTPATIENT_CLINIC_OR_DEPARTMENT_OTHER): Payer: Self-pay | Admitting: Surgery

## 2013-09-27 ENCOUNTER — Encounter (INDEPENDENT_AMBULATORY_CARE_PROVIDER_SITE_OTHER): Payer: Self-pay | Admitting: Surgery

## 2013-09-27 ENCOUNTER — Ambulatory Visit (INDEPENDENT_AMBULATORY_CARE_PROVIDER_SITE_OTHER): Payer: PRIVATE HEALTH INSURANCE | Admitting: Surgery

## 2013-09-27 ENCOUNTER — Encounter (INDEPENDENT_AMBULATORY_CARE_PROVIDER_SITE_OTHER): Payer: Self-pay

## 2013-09-27 VITALS — BP 123/75 | HR 94 | Temp 98.3°F | Resp 16 | Ht 61.0 in | Wt 131.4 lb

## 2013-09-27 DIAGNOSIS — L723 Sebaceous cyst: Secondary | ICD-10-CM

## 2013-09-27 NOTE — Progress Notes (Signed)
Status post excision of a ruptured sebaceous cyst of the right axilla on 09/08/13. The wound is completely healed and doing well. She has no pain. He firm scar tissue should resolve over time. She may follow up with [place] as needed.  Imogene Burn. Georgette Dover, MD, [facility] Surgery  General/ Trauma Surgery  09/27/2013 4:31 PM

## 2016-09-04 ENCOUNTER — Other Ambulatory Visit: Payer: Self-pay | Admitting: Occupational Medicine

## 2016-09-04 ENCOUNTER — Ambulatory Visit: Payer: Self-pay

## 2016-09-04 DIAGNOSIS — M79644 Pain in right finger(s): Secondary | ICD-10-CM

## 2016-12-23 ENCOUNTER — Other Ambulatory Visit: Payer: Self-pay | Admitting: Family Medicine

## 2016-12-23 DIAGNOSIS — Z1231 Encounter for screening mammogram for malignant neoplasm of breast: Secondary | ICD-10-CM

## 2016-12-24 ENCOUNTER — Ambulatory Visit
Admission: RE | Admit: 2016-12-24 | Discharge: 2016-12-24 | Disposition: A | Discharge status: Home / Self Care | Payer: BLUE CROSS/BLUE SHIELD | Source: Ambulatory Visit | Attending: Family Medicine | Admitting: Family Medicine

## 2016-12-24 DIAGNOSIS — Z1231 Encounter for screening mammogram for malignant neoplasm of breast: Secondary | ICD-10-CM

## 2017-01-02 ENCOUNTER — Encounter: Payer: Self-pay | Admitting: Gastroenterology

## 2017-02-23 ENCOUNTER — Ambulatory Visit (AMBULATORY_SURGERY_CENTER): Payer: Self-pay | Admitting: *Deleted

## 2017-02-23 VITALS — Ht 61.0 in | Wt 135.0 lb

## 2017-02-23 DIAGNOSIS — Z1211 Encounter for screening for malignant neoplasm of colon: Secondary | ICD-10-CM

## 2017-02-23 MED ORDER — NA SULFATE-K SULFATE-MG SULF 17.5-3.13-1.6 GM/177ML PO SOLN: 1.0000 | Freq: Once | ORAL | 0 refills | Status: AC

## 2017-02-23 NOTE — Progress Notes (Signed)
No egg or soy allergy known to patient  No issues with past sedation with any surgeries  or procedures, no intubation problems  No diet pills per patient No home 02 use per patient  No blood thinners per patient  Pt denies issues with constipation  No A fib or A flutter  EMMI video sent to pt's e mail pt declined   

## 2017-03-04 ENCOUNTER — Encounter: Payer: Self-pay | Admitting: Gastroenterology

## 2017-03-06 ENCOUNTER — Telehealth: Payer: Self-pay | Admitting: Gastroenterology

## 2017-03-09 ENCOUNTER — Ambulatory Visit (AMBULATORY_SURGERY_CENTER): Payer: BLUE CROSS/BLUE SHIELD | Admitting: Gastroenterology

## 2017-03-09 ENCOUNTER — Encounter: Payer: Self-pay | Admitting: Gastroenterology

## 2017-03-09 VITALS — BP 141/88 | HR 79 | Temp 98.6°F | Resp 13 | Ht 61.0 in | Wt 131.0 lb

## 2017-03-09 DIAGNOSIS — Z1212 Encounter for screening for malignant neoplasm of rectum: Secondary | ICD-10-CM | POA: Diagnosis not present

## 2017-03-09 DIAGNOSIS — Z1211 Encounter for screening for malignant neoplasm of colon: Secondary | ICD-10-CM

## 2017-03-09 MED ORDER — SODIUM CHLORIDE 0.9 % IV SOLN: 500.0000 mL | INTRAVENOUS | Status: DC

## 2017-03-09 NOTE — Op Note (Signed)
[facility] Patient Name: Trudee Chirino Procedure Date: 03/09/2017 8:21 AM MRN: [id number] Endoscopist: Mallie Mussel L. Loletha Carrow , MD Age: 51 Referring MD:  Date of Birth: 08-11-1965 Gender: Female Account #: [id number] Procedure:                Colonoscopy Indications:              Screening for colorectal malignant neoplasm, This                            is the patient's first colonoscopy Medicines:                Monitored Anesthesia Care Procedure:                Pre-Anesthesia Assessment:                           - Prior to the procedure, a History and Physical                            was performed, and patient medications and                            allergies were reviewed. The patient's tolerance of                            previous anesthesia was also reviewed. The risks                            and benefits of the procedure and the sedation                            options and risks were discussed with the patient.                            All questions were answered, and informed consent                            was obtained. Prior Anticoagulants: The patient has                            taken no previous anticoagulant or antiplatelet                            agents. ASA Grade Assessment: I - A normal, healthy                            patient. After reviewing the risks and benefits,                            the patient was deemed in satisfactory condition to                            undergo the procedure.  After obtaining informed consent, the colonoscope                            was passed under direct vision. Throughout the                            procedure, the patient's blood pressure, pulse, and                            oxygen saturations were monitored continuously. The                            Colonoscope was introduced through the anus and                            advanced to the the cecum, identified  by                            appendiceal orifice and ileocecal valve. The                            colonoscopy was performed without difficulty. The                            patient tolerated the procedure well. The quality                            of the bowel preparation was excellent. The                            ileocecal valve, appendiceal orifice, and rectum                            were photographed. The quality of the bowel                            preparation was evaluated using the BBPS [facility]                            Bowel Preparation Scale) with scores of: Right                            Colon = 3, Transverse Colon = 3 and Left Colon = 3                            (entire mucosa seen well with no residual staining,                            small fragments of stool or opaque liquid). The                            total BBPS score equals 9. The bowel preparation  used was Miralax. Scope In: 8:29:45 AM Scope Out: 8:43:34 AM Scope Withdrawal Time: 0 hours 12 minutes 1 second  Total Procedure Duration: 0 hours 13 minutes 49 seconds  Findings:                 The perianal and digital rectal examinations were                            normal.                           The entire examined colon appeared normal on direct                            and retroflexion views. Complications:            No immediate complications. Estimated Blood Loss:     Estimated blood loss: none. Impression:               - The entire examined colon is normal on direct and                            retroflexion views.                           - No specimens collected. Recommendation:           - Patient has a contact number available for                            emergencies. The signs and symptoms of potential                            delayed complications were discussed with the                            patient. Return to normal activities  tomorrow.                            Written discharge instructions were provided to the                            patient.                           - Resume previous diet.                           - Continue present medications.                           - Repeat colonoscopy in 10 years for screening                            purposes. Dillon Mcreynolds L. Loletha Carrow, MD 03/09/2017 8:48:14 AM This report has been signed electronically.

## 2017-03-09 NOTE — Progress Notes (Signed)
Report given to PACU, vss 

## 2017-03-09 NOTE — Telephone Encounter (Signed)
Pt has arrived for her colon and states she did miralax and dulcolax prep  [facility] closed Friday!    Rebecca Melendez PV

## 2017-03-09 NOTE — Patient Instructions (Signed)
Discharge instructions given. Normal exam. Resume previous medications. YOU HAD AN ENDOSCOPIC PROCEDURE TODAY AT THE [facility]:   Refer to the procedure report that was given to you for any specific questions about what was found during the examination.  If the procedure report does not answer your questions, please call your gastroenterologist to clarify.  If you requested that your care partner not be given the details of your procedure findings, then the procedure report has been included in a sealed envelope for you to review at your convenience later.  YOU SHOULD EXPECT: Some feelings of bloating in the abdomen. Passage of more gas than usual.  Walking can help get rid of the air that was put into your GI tract during the procedure and reduce the bloating. If you had a lower endoscopy (such as a colonoscopy or flexible sigmoidoscopy) you may notice spotting of blood in your stool or on the toilet paper. If you underwent a bowel prep for your procedure, you may not have a normal bowel movement for a few days.  Please Note:  You might notice some irritation and congestion in your nose or some drainage.  This is from the oxygen used during your procedure.  There is no need for concern and it should clear up in a day or so.  SYMPTOMS TO REPORT IMMEDIATELY:   Following lower endoscopy (colonoscopy or flexible sigmoidoscopy):  Excessive amounts of blood in the stool  Significant tenderness or worsening of abdominal pains  Swelling of the abdomen that is new, acute  Fever of 100F or higher   For urgent or emergent issues, a gastroenterologist can be reached at any hour by calling [phone].   DIET:  We do recommend a small meal at first, but then you may proceed to your regular diet.  Drink plenty of fluids but you should avoid alcoholic beverages for 24 hours.  ACTIVITY:  You should plan to take it easy for the rest of today and you should NOT DRIVE or use heavy machinery  until tomorrow (because of the sedation medicines used during the test).    FOLLOW UP: Our staff will call the number listed on your records the next business day following your procedure to check on you and address any questions or concerns that you may have regarding the information given to you following your procedure. If we do not reach you, we will leave a message.  However, if you are feeling well and you are not experiencing any problems, there is no need to return our call.  We will assume that you have returned to your regular daily activities without incident.  If any biopsies were taken you will be contacted by phone or by letter within the next 1-3 weeks.  Please call us at [phone] if you have not heard about the biopsies in 3 weeks.    SIGNATURES/CONFIDENTIALITY: You and/or your care partner have signed paperwork which will be entered into your electronic medical record.  These signatures attest to the fact that that the information above on your After Visit Summary has been reviewed and is understood.  Full responsibility of the confidentiality of this discharge information lies with you and/or your care-partner. 

## 2017-03-10 ENCOUNTER — Telehealth: Payer: Self-pay | Admitting: *Deleted

## 2017-03-10 NOTE — Telephone Encounter (Signed)
  Follow up Call-  Call back number 03/09/2017  Post procedure Call Back phone  # [phone]  Permission to leave phone message Yes  Some recent data might be hidden     Patient questions:  Do you have a fever, pain , or abdominal swelling? No. Pain Score  0 *  Have you tolerated food without any problems? Yes.    Have you been able to return to your normal activities? Yes.    Do you have any questions about your discharge instructions: Diet   No. Medications  No. Follow up visit  No.  Do you have questions or concerns about your Care? No.  Actions: * If pain score is 4 or above: No action needed, pain <4.

## 2018-02-09 ENCOUNTER — Other Ambulatory Visit: Payer: Self-pay | Admitting: Family Medicine

## 2018-02-09 DIAGNOSIS — Z1231 Encounter for screening mammogram for malignant neoplasm of breast: Secondary | ICD-10-CM

## 2018-02-26 ENCOUNTER — Ambulatory Visit
Admission: RE | Admit: 2018-02-26 | Discharge: 2018-02-26 | Disposition: A | Discharge status: Home / Self Care | Payer: BLUE CROSS/BLUE SHIELD | Source: Ambulatory Visit | Attending: Family Medicine | Admitting: Family Medicine

## 2018-02-26 DIAGNOSIS — Z1231 Encounter for screening mammogram for malignant neoplasm of breast: Secondary | ICD-10-CM

## 2018-03-02 ENCOUNTER — Other Ambulatory Visit: Payer: Self-pay | Admitting: Family Medicine

## 2018-03-02 DIAGNOSIS — R921 Mammographic calcification found on diagnostic imaging of breast: Secondary | ICD-10-CM

## 2018-03-05 ENCOUNTER — Ambulatory Visit
Admission: RE | Admit: 2018-03-05 | Discharge: 2018-03-05 | Disposition: A | Discharge status: Home / Self Care | Payer: BLUE CROSS/BLUE SHIELD | Source: Ambulatory Visit | Attending: Family Medicine | Admitting: Family Medicine

## 2018-03-05 ENCOUNTER — Other Ambulatory Visit: Payer: Self-pay | Admitting: Family Medicine

## 2018-03-05 DIAGNOSIS — R921 Mammographic calcification found on diagnostic imaging of breast: Secondary | ICD-10-CM

## 2018-09-03 ENCOUNTER — Other Ambulatory Visit: Payer: Self-pay

## 2018-09-03 ENCOUNTER — Ambulatory Visit
Admission: RE | Admit: 2018-09-03 | Discharge: 2018-09-03 | Disposition: A | Discharge status: Home / Self Care | Payer: BLUE CROSS/BLUE SHIELD | Source: Ambulatory Visit | Attending: Family Medicine | Admitting: Family Medicine

## 2018-09-03 DIAGNOSIS — R921 Mammographic calcification found on diagnostic imaging of breast: Secondary | ICD-10-CM

## 2019-03-27 ENCOUNTER — Other Ambulatory Visit: Payer: Self-pay

## 2019-03-27 ENCOUNTER — Ambulatory Visit (HOSPITAL_COMMUNITY)
Admission: EM | Admit: 2019-03-27 | Discharge: 2019-03-27 | Disposition: A | Discharge status: Home / Self Care | Payer: BC Managed Care – PPO | Attending: Family | Admitting: Family

## 2019-03-27 ENCOUNTER — Encounter (HOSPITAL_COMMUNITY): Payer: Self-pay

## 2019-03-27 DIAGNOSIS — L509 Urticaria, unspecified: Secondary | ICD-10-CM | POA: Diagnosis not present

## 2019-03-27 DIAGNOSIS — R220 Localized swelling, mass and lump, head: Secondary | ICD-10-CM

## 2019-03-27 DIAGNOSIS — H5789 Other specified disorders of eye and adnexa: Secondary | ICD-10-CM | POA: Diagnosis not present

## 2019-03-27 DIAGNOSIS — Z8669 Personal history of other diseases of the nervous system and sense organs: Secondary | ICD-10-CM

## 2019-03-27 MED ORDER — PREDNISONE 10 MG (21) PO TBPK: ORAL_TABLET | Freq: Every day | ORAL | 0 refills | Status: DC

## 2019-03-27 NOTE — ED Triage Notes (Signed)
Pt present allergic reaction from eye drops. Pt states that her face begin to swell on Thursday after she adminstered eye drops in both eyes.
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
## 2019-03-27 NOTE — Discharge Instructions (Addendum)
Recommend- do not put any additional drops in your eyes today. Start oral Prednisone 10mg  tablets- take 6 today and then decrease by 1 tablet each day until finished. Apply cool compresses to eye and facial area for comfort. If any increased swelling, more difficulty with vision, difficulty swallowing or breathing occurs, go to the ER ASAP. Otherwise, follow-up with your eye doctor tomorrow morning as planned.

## 2019-03-29 ENCOUNTER — Other Ambulatory Visit: Payer: Self-pay | Admitting: Family Medicine

## 2019-03-29 DIAGNOSIS — R921 Mammographic calcification found on diagnostic imaging of breast: Secondary | ICD-10-CM

## 2019-03-31 ENCOUNTER — Ambulatory Visit
Admission: RE | Admit: 2019-03-31 | Discharge: 2019-03-31 | Disposition: A | Discharge status: Home / Self Care | Payer: BC Managed Care – PPO | Source: Ambulatory Visit | Attending: Family Medicine | Admitting: Family Medicine

## 2019-03-31 ENCOUNTER — Other Ambulatory Visit: Payer: Self-pay

## 2019-03-31 DIAGNOSIS — R921 Mammographic calcification found on diagnostic imaging of breast: Secondary | ICD-10-CM

## 2019-06-30 IMAGING — MG DIGITAL DIAGNOSTIC UNILATERAL RIGHT MAMMOGRAM
8 series · 8 of 8 positions shown · non-contrast
Comparison: Previous exam(s).

CLINICAL DATA: 52-year-old female for further evaluation of RIGHT
breast calcifications identified on screening mammogram

EXAM:
DIGITAL DIAGNOSTIC RIGHT MAMMOGRAM

[R MLO]
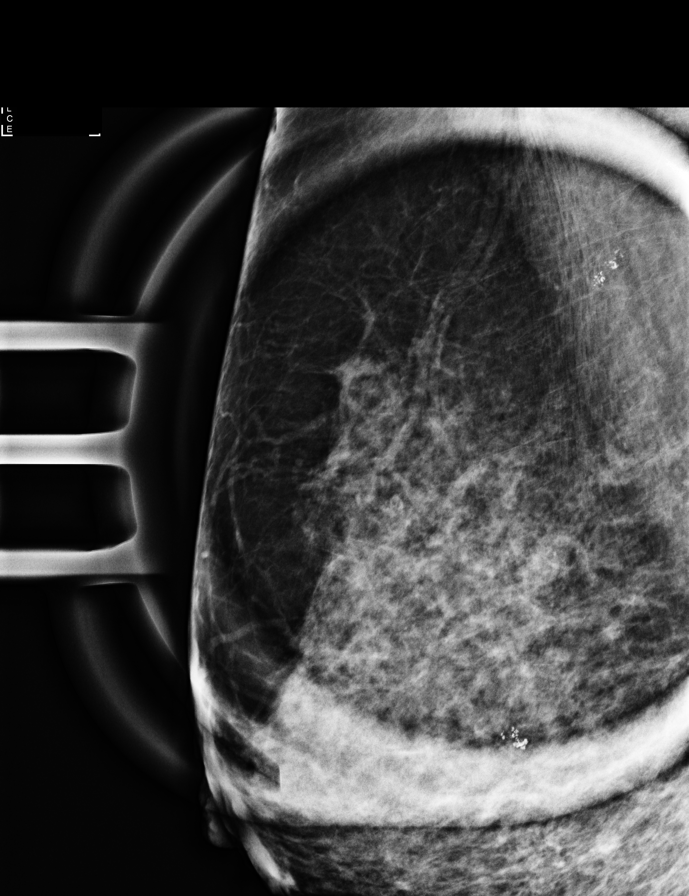

[R ML (1 of 2)]
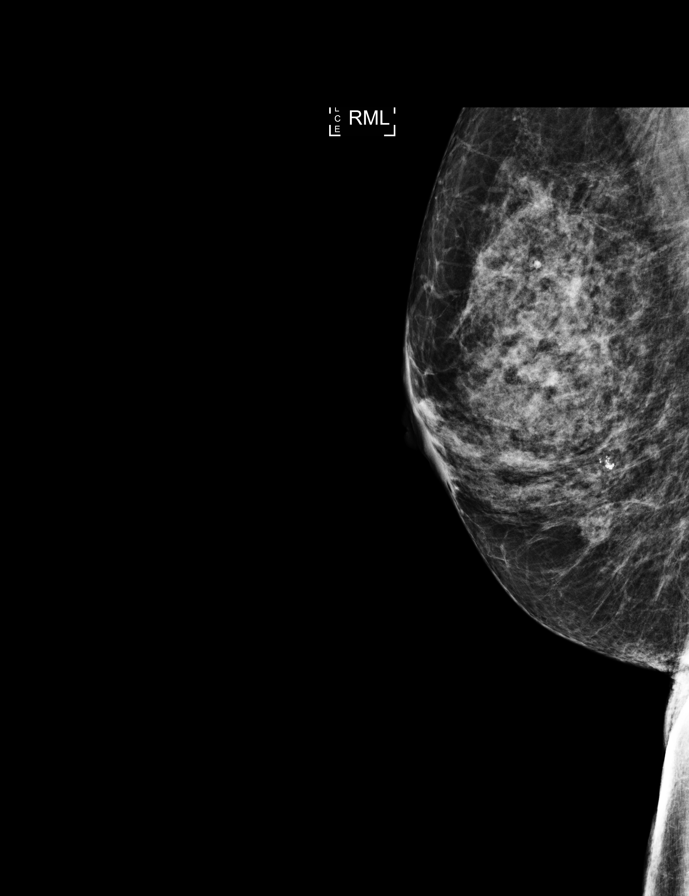

[R XCCL]
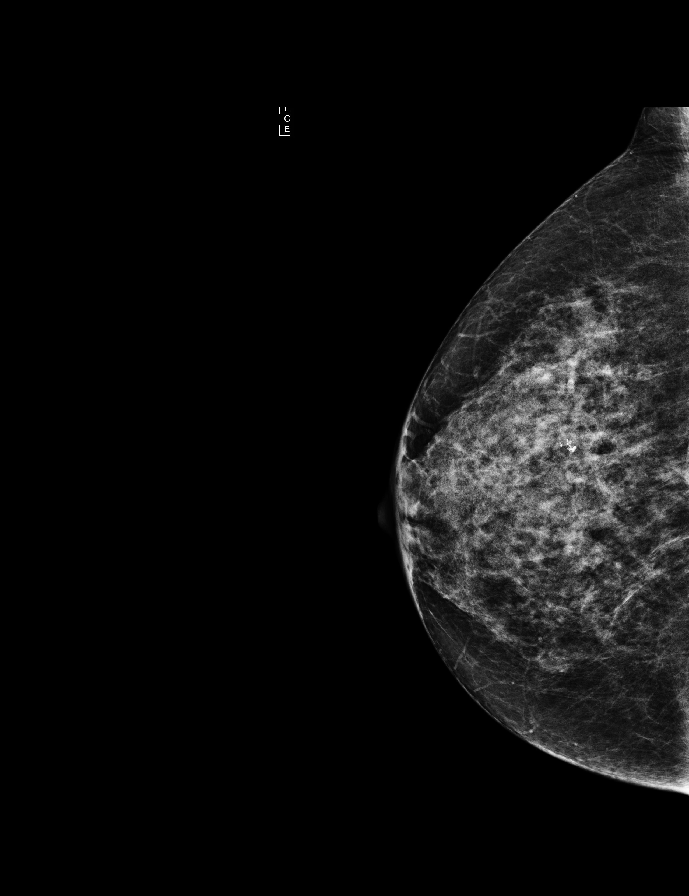

[R CC (1 of 2)]
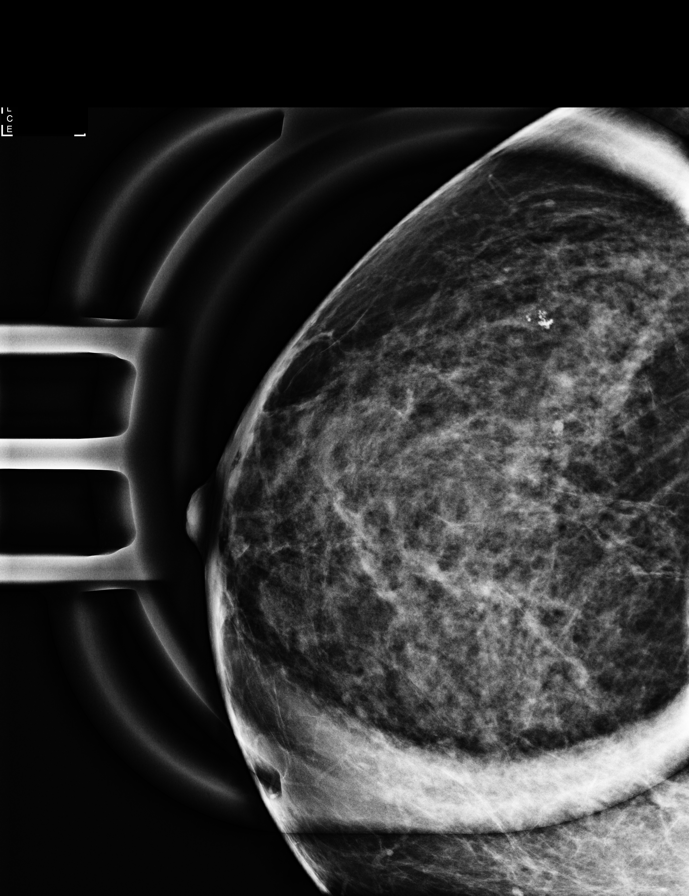

[R LM (1 of 2)]
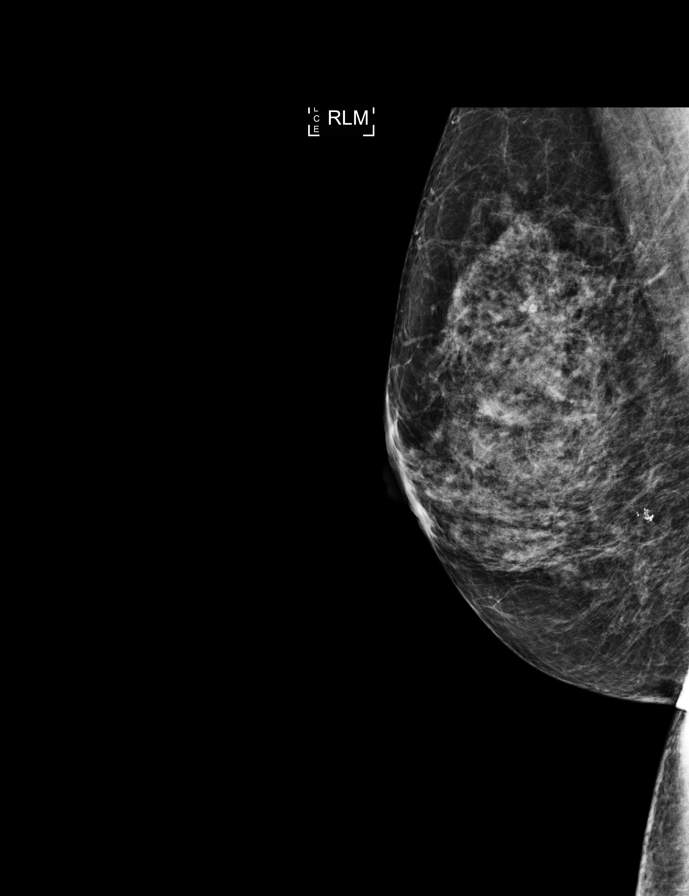

[R LM (2 of 2)]
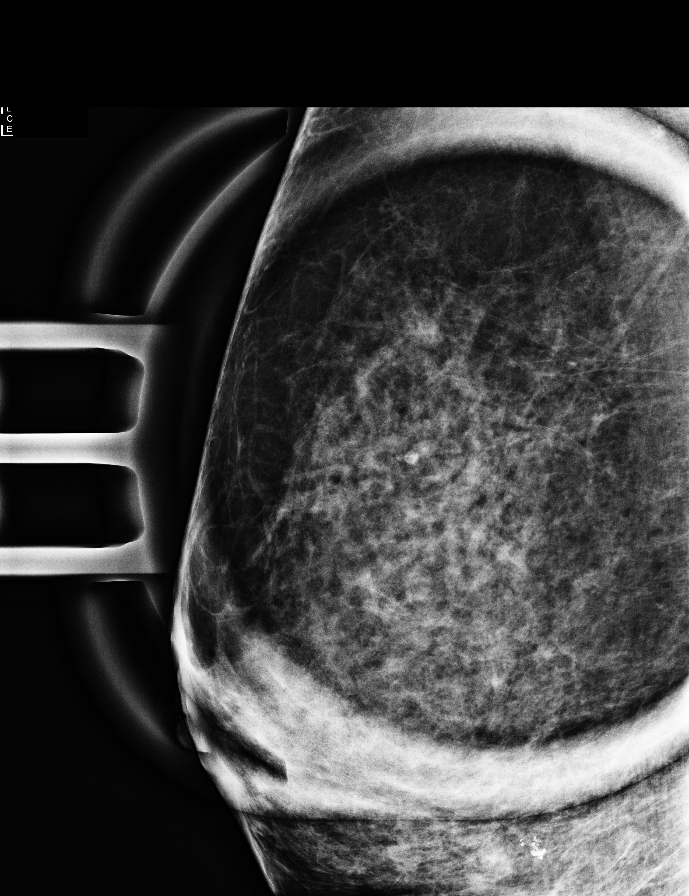

[R CC (2 of 2)]
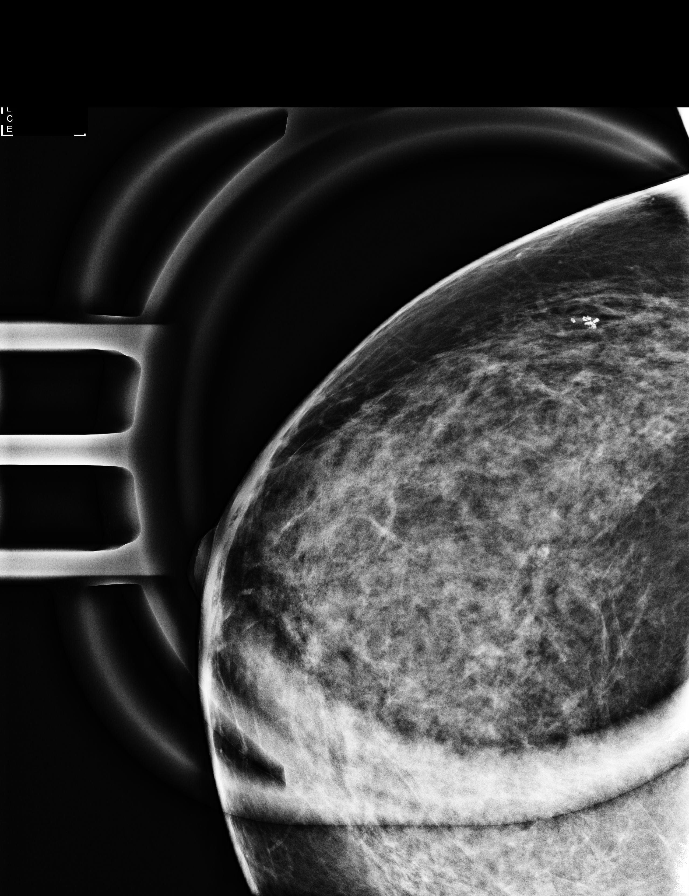

[R ML (2 of 2)]
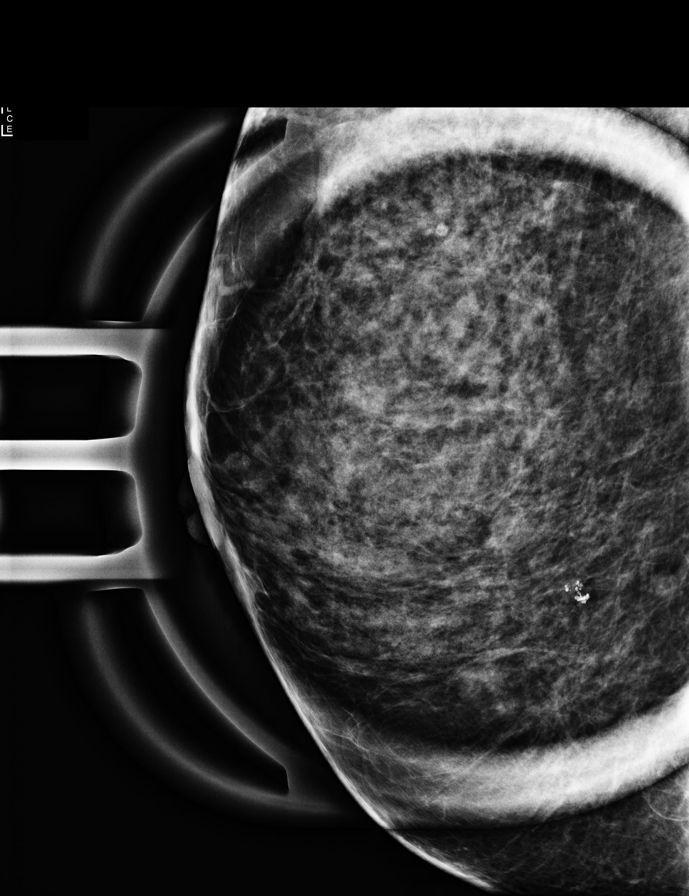

[8 of 8 positions shown; findings below may reference images not displayed]

ACR Breast Density Category c: The breast tissue is heterogeneously
dense, which may obscure small masses.
FINDINGS: Full field and magnification views of the RIGHT breast demonstrate 2
groups of coarse calcifications. A 4 mm group lies within the LOWER
OUTER RIGHT breast and a 7 mm group lies within the posterior UPPER
RIGHT breast. No suspicious forms or associated mass/distortion
identified.
IMPRESSION: Two groups of likely benign calcifications within the RIGHT breast.
6 month follow-up is recommended to ensure stability.

RECOMMENDATION:
LEFT diagnostic mammogram with magnification views in 6 months

I have discussed the findings and recommendations with the patient.
Results were also provided in writing at the conclusion of the
visit. If applicable, a reminder letter will be sent to the patient
regarding the next appointment.

BI-RADS CATEGORY  3: Probably benign.

## 2020-06-13 ENCOUNTER — Other Ambulatory Visit: Payer: Self-pay | Admitting: Family Medicine

## 2020-06-13 DIAGNOSIS — R928 Other abnormal and inconclusive findings on diagnostic imaging of breast: Secondary | ICD-10-CM

## 2020-07-24 ENCOUNTER — Ambulatory Visit
Admission: RE | Admit: 2020-07-24 | Discharge: 2020-07-24 | Disposition: A | Discharge status: Home / Self Care | Payer: BC Managed Care – PPO | Source: Ambulatory Visit | Attending: Family Medicine | Admitting: Family Medicine

## 2020-07-24 ENCOUNTER — Other Ambulatory Visit: Payer: Self-pay

## 2020-07-24 DIAGNOSIS — R928 Other abnormal and inconclusive findings on diagnostic imaging of breast: Secondary | ICD-10-CM

## 2022-10-04 ENCOUNTER — Emergency Department (HOSPITAL_COMMUNITY)
Admission: EM | Admit: 2022-10-04 | Discharge: 2022-10-04 | Disposition: A | Discharge status: Home / Self Care | Payer: BC Managed Care – PPO | Attending: Emergency Medicine | Admitting: Emergency Medicine

## 2022-10-04 ENCOUNTER — Other Ambulatory Visit: Payer: Self-pay

## 2022-10-04 ENCOUNTER — Encounter (HOSPITAL_COMMUNITY): Payer: Self-pay

## 2022-10-04 DIAGNOSIS — K611 Rectal abscess: Secondary | ICD-10-CM | POA: Diagnosis not present

## 2022-10-04 MED ORDER — LIDOCAINE HCL (PF) 1 % IJ SOLN
5.0000 mL | Freq: Once | INTRAMUSCULAR | Status: AC
  Administered 2022-10-04: 5 mL
  Filled 2022-10-04: qty 5

## 2022-10-04 NOTE — ED Provider Notes (Signed)
Supervised resident visit.  Patient here with rectal pain.  She appears to have a small perirectal abscess on exam.  No major medical problems.  Will perform I&D and discharge.  This is very superficial.  She has normal vitals.  No fever.  She is already been on antibiotics prescribed by urgent care.  This chart was dictated using voice recognition software.  Despite best efforts to proofread,  errors can occur which can change the documentation meaning.    Virgina Norfolk, DO 10/04/22 1605

## 2022-10-04 NOTE — ED Triage Notes (Signed)
Pt came in via POV d/t a Lt sided perirectal abscess. A/Ox4, rates pain 10/10, denies drainage or fevers.

## 2022-10-04 NOTE — ED Provider Notes (Signed)
[place] EM[facility]MENT AT [facility] Provider Note  Medical Decision Making   HPI: Rebecca Melendez is a 57 y.o. female with no perinent history who presents complaining of perirectal abscess. Patient arrived via POV.  History provided by patient.  No interpreter required for this encounter.  Patient reports that she began having pressure around her anus on the left side ending 4/23.  Denies any fevers, chills.  Reports that she has continued to have bowel movements.  Denies prior history of perirectal abscesses.  Reports that she presented to an outside urgent care earlier today, was diagnosed with perirectal abscess, however they did not drain it at the urgent care, they prescribed her Bactrim and gave her referral to a surgeon, however patient feels that her pain and discomfort when sitting is too great and that she is unable to wait until Tuesday for an appointment  ROS: As per HPI. Please see MAR for complete past medical history, surgical history, and social history.   Physical exam is pertinent for proximately 2 cm x 2 cm area of erythema and fluctuance to the subcutaneous tissue just lateral to the rectum in the o'clock position.   The differential includes but is not limited to abscess, perirectal abscess, rectal abscess  Additional history obtained from: Outside records External records from outside source obtained and reviewed including: Patient has paper records from outside urgent care in which patient was referred to general surgery and was prescribed Bactrim  ED provider interpretation of ECG: Not indicated  ED provider interpretation of radiology/imaging: Performed an ultrasound, I appreciate a superficial abscess that is approximately 2.5 cm x 2.5 cm x 2 cm with swirling internally when compressed  Labs ordered were interpreted by myself as well as my attending and were incorporated into the medical decision making process for this patient.  ED  provider interpretation of labs: Not indicated  Interventions: Lidocaine  See the EMR for full details regarding lab and imaging results.  Exam patient is systemically well-appearing, afebrile.  Initially mildly tachycardic, however heart rate improved and was normal rhythm and rate on my exam.  Without any tenderness of the rectal sphincter or proximal to the rectal sphincter on DRE.  Ultrasound does not demonstrate invasion of the muscular layers.  Gas General surgery referral versus I&D.  Patient prefers I&D for pain.  Procedure performed as per procedure note below.  Patient tolerated well, patient is already prescribed appropriate antibiotic.  Patient request referral to our surgery group as well, referral placed, stated that she is find follow-up with any general surgeon.  Discharged in stable condition.     Consults: Not indicated  Disposition: DISCHARGE: I believe that the patient is safe for discharge home with outpatient follow-up. Patient was informed of all pertinent physical exam, laboratory, and imaging findings. Patient's suspected etiology of their symptom presentation was discussed with the patient and all questions were answered. We discussed following up with PCP, general surgery. I provided thorough ED return precautions. The patient feels safe and comfortable with this plan.  The plan for this patient was discussed with Dr. Lockie Mola, who voiced agreement and who oversaw evaluation and treatment of this patient.  Clinical Impression:  1. Perirectal abscess    Discharge  Therapies: These medications and interventions were provided for the patient while in the ED. Medications  lidocaine (PF) (XYLOCAINE) 1 % injection 5 mL (5 mLs Infiltration Given by Other 10/04/22 1640)    MDM generated using voice dictation software and may  contain dictation errors.  Please contact me for any clarification or with any questions.  Clinical Complexity A medically appropriate history,  review of systems, and physical exam was performed.  Collateral history obtained from: Record review I personally reviewed the labs, EKG, imaging as discussed above. Patient's presentation is most consistent with acute complicated illness / injury requiring diagnostic workup Considered and ruled out life and body threatening conditions  Treatment: Outpatient referral Medications: Prescription Discussed patient's care with providers from the following different specialties: None  Physical Exam   ED Triage Vitals  Enc Vitals Group     BP 10/04/22 1615 (!) 125/90     Pulse Rate 10/04/22 1615 (!) 103     Resp 10/04/22 1615 14     Temp 10/04/22 1735 98.8 F (37.1 C)     Temp src --      SpO2 10/04/22 1615 99 %     Weight --      Height --      Head Circumference --      Peak Flow --      Pain Score 10/04/22 1450 10     Pain Loc --      Pain Edu? --      Excl. in GC? --     Physical Exam    Procedure Note  .Marland KitchenIncision and Drainage  Date/Time: 10/04/2022 5:43 PM  Performed by: Curley Spice, MD Authorized by: Virgina Norfolk, DO   Consent:    Consent obtained:  Verbal   Consent given by:  Patient   Risks, benefits, and alternatives were discussed: yes     Risks discussed:  Bleeding, damage to other organs, incomplete drainage, infection and pain   Alternatives discussed:  Referral, observation and no treatment Universal protocol:    Patient identity confirmed:  Verbally with patient Location:    Type:  Abscess   Size:  2cm X 2cm   Location:  Anogenital   Anogenital location:  Perianal Pre-procedure details:    Skin preparation:  Chlorhexidine Sedation:    Sedation type:  None Anesthesia:    Anesthesia method:  Local infiltration   Local anesthetic:  Lidocaine 1% w/o epi Procedure type:    Complexity:  Complex Procedure details:    Ultrasound guidance: yes     Incision types:  Cruciate   Incision depth:  Subcutaneous   Wound management:  Probed and deloculated  and extensive cleaning   Drainage:  Bloody and purulent   Drainage amount:  Moderate   Wound treatment:  Wound left open   Packing materials:  1/4 in iodoform gauze   Amount 1/4" iodoform:  5 inches Post-procedure details:    Procedure completion:  Tolerated well, no immediate complications Wound packing  Date/Time: 10/04/2022 5:43 PM  Performed by: Curley Spice, MD Authorized by: Virgina Norfolk, DO  Consent: Verbal consent obtained. Consent given by: patient Imaging studies: imaging studies available Required items: required blood products, implants, devices, and special equipment available Patient identity confirmed: verbally with patient Time out: Immediately prior to procedure a "time out" was called to verify the correct patient, procedure, equipment, support staff and site/side marked as required. Preparation: Patient was prepped and draped in the usual sterile fashion. Local anesthesia used: yes Anesthesia: local infiltration  Anesthesia: Local anesthesia used: yes Anesthetic total: 5 mL  Sedation: Patient sedated: no  Patient tolerance: patient tolerated the procedure well with no immediate complications     No orders to display    Julianne Rice, MD  Emergency Medicine, PGY-2   Curley Spice, MD 10/04/22 1745    Virgina Norfolk, DO 10/04/22 2956

## 2022-10-04 NOTE — Discharge Instructions (Signed)
Rebecca Melendez  Thank you for allowing [place] to take care of you today.  You came to the Emergency Department today because had pain around her anus for several days.  You were seen at an urgent care diagnosed with perirectal abscess, they prescribed you Bactrim and referred you to a surgeon, however giving such bad pain that you are not able to tolerate sitting, so you came here to have the area drained.  We performed a drainage at bedside and placed packing.  You also do need antibiotics and follow-up.  The Bactrim that you were prescribed at the urgent care is an appropriate antibiotic, please take this as prescribed.  We will also give you a referral to one of our surgeons, you should get a call on Monday to schedule this appointment.  If the other referral comes in faster, it is fine for you to follow-up with the other surgeon and cancel the follow-up with our surgeon.  To-Do: 1. Please follow-up with your primary doctor within 2 days / as soon as possible.   Please return to the Emergency Department or call 911 if you experience have worsening of your symptoms, or do not get better, worsening swelling, pain, inability to sit, chest pain, shortness of breath, severe or significantly worsening pain, high fever, severe confusion, pass out or have any reason to think that you need emergency medical care.   We hope you feel better soon.   Curley Spice, MD Department of Emergency Medicine [facility]

## 2023-01-21 ENCOUNTER — Ambulatory Visit (HOSPITAL_COMMUNITY)
Admission: EM | Admit: 2023-01-21 | Discharge: 2023-01-21 | Disposition: A | Discharge status: Home / Self Care | Payer: BC Managed Care – PPO | Attending: Physician Assistant | Admitting: Physician Assistant

## 2023-01-21 ENCOUNTER — Encounter (HOSPITAL_COMMUNITY): Payer: Self-pay | Admitting: *Deleted

## 2023-01-21 DIAGNOSIS — R030 Elevated blood-pressure reading, without diagnosis of hypertension: Secondary | ICD-10-CM

## 2023-01-21 DIAGNOSIS — B349 Viral infection, unspecified: Secondary | ICD-10-CM | POA: Insufficient documentation

## 2023-01-21 DIAGNOSIS — Z1152 Encounter for screening for COVID-19: Secondary | ICD-10-CM | POA: Insufficient documentation

## 2023-01-21 DIAGNOSIS — R051 Acute cough: Secondary | ICD-10-CM

## 2023-01-21 MED ORDER — PROMETHAZINE-DM 6.25-15 MG/5ML PO SYRP: 5.0000 mL | ORAL_SOLUTION | Freq: Two times a day (BID) | ORAL | 0 refills | Status: AC | PRN

## 2023-01-21 NOTE — ED Triage Notes (Signed)
Pt states she has had diarrhea and chills since Monday and she missed work yesterday and was told not to come back until she seen someone. She has been taking tylenol cold and flu and apple cider without relief.

## 2023-01-21 NOTE — Discharge Instructions (Signed)
We will contact you if you are positive for COVID.  If you would like to confirm your results please call us with any questions.  These results should be back within 24 hours.  Use over-the-counter medications but try to avoid anything with a decongestant (see below).  I recommend nasal saline, sinus rinses, humidifier, Flonase, Mucinex, Tylenol.  I have called in Promethazine DM for cough.  This can make you sleepy so do not drive or drink alcohol while taking it.  Eat a bland diet and drink plenty of fluids.  If anything worsens and you have abdominal pain, chest pain, shortness of breath, high fever, blood in your stool, vomiting, weakness you need to be seen immediately.  Your blood pressure is elevated.  I believe this is related to your illness and medications.  Avoid decongestants, caffeine, sodium, NSAIDs (aspirin, ibuprofen/Advil, naproxen/Aleve).  Monitor your blood pressure once you are feeling better.  If this is persistently above 140/90 you need to return for reevaluation.  If you develop any chest pain, shortness of breath, headache, vision change, dizziness in the setting of high blood pressure you need to be seen immediately.
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
## 2023-01-22 LAB — SARS CORONAVIRUS 2 (TAT 6-24 HRS): SARS Coronavirus 2: NEGATIVE

## 2023-08-24 ENCOUNTER — Ambulatory Visit (HOSPITAL_COMMUNITY): Admission: EM | Admit: 2023-08-24 | Discharge: 2023-08-24 | Disposition: A | Discharge status: Home / Self Care

## 2023-08-24 ENCOUNTER — Other Ambulatory Visit: Payer: Self-pay

## 2023-08-24 ENCOUNTER — Encounter (HOSPITAL_COMMUNITY): Payer: Self-pay | Admitting: Emergency Medicine

## 2023-08-24 DIAGNOSIS — J069 Acute upper respiratory infection, unspecified: Secondary | ICD-10-CM | POA: Diagnosis not present

## 2023-08-24 LAB — POC COVID19/FLU A&B COMBO
Covid Antigen, POC: NEGATIVE
Influenza A Antigen, POC: NEGATIVE
Influenza B Antigen, POC: NEGATIVE

## 2023-08-24 NOTE — Discharge Instructions (Addendum)
 Your testing was negative for COVID and flu.  Based on your described symptoms and the duration of symptoms it is likely that you have a viral upper respiratory infection (often called a "cold")  Symptoms can last for 3-10 days with lingering cough and intermittent symptoms lasting weeks after that.  The goal of treatment at this time is to reduce your symptoms and discomfort   I recommend using Robitussin and Mucinex (regular formulations, nothing with decongestants or DM)  You can also use Tylenol for body aches and fever reduction I also recommend adding an antihistamine to your daily regimen This includes medications like Claritin, Allegra, Zyrtec- the generics of these work very well and are usually less expensive I recommend using Flonase nasal spray - 2 puffs twice per day to help with your nasal congestion The antihistamines and Flonase can take a few weeks to provide significant relief from allergy symptoms but should start to provide some benefit soon. You can use a humidifier at night to help with preventing nasal dryness and irritation   If your symptoms are not improving or seem to be getting worse over the next 5 to 7 days you can always return here to urgent care or you can follow-up with your primary care provider for ongoing management Go to the ER if you begin to have more serious symptoms such as shortness of breath, trouble breathing, loss of consciousness, swelling around the eyes, high fever, severe lasting headaches, vision changes or neck pain/stiffness.

## 2023-08-24 NOTE — ED Notes (Signed)
 Reviewed work note

## 2023-08-24 NOTE — ED Triage Notes (Signed)
 Facial pain and congestion for 2 days.  Has taken alka seltzer plus with no relief.  Patient has a slight cough, slight chill, but not feeling feverish.
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
## 2023-08-31 ENCOUNTER — Encounter (HOSPITAL_COMMUNITY): Payer: Self-pay

## 2023-08-31 ENCOUNTER — Ambulatory Visit (HOSPITAL_COMMUNITY)
Admission: EM | Admit: 2023-08-31 | Discharge: 2023-08-31 | Disposition: A | Discharge status: Home / Self Care | Attending: Family Medicine | Admitting: Family Medicine

## 2023-08-31 DIAGNOSIS — J0100 Acute maxillary sinusitis, unspecified: Secondary | ICD-10-CM | POA: Diagnosis not present

## 2023-08-31 DIAGNOSIS — R030 Elevated blood-pressure reading, without diagnosis of hypertension: Secondary | ICD-10-CM | POA: Diagnosis not present

## 2023-08-31 MED ORDER — AMOXICILLIN 875 MG PO TABS: 875.0000 mg | ORAL_TABLET | Freq: Two times a day (BID) | ORAL | 0 refills | Status: AC

## 2023-08-31 NOTE — ED Triage Notes (Signed)
 Pt presents to UC for c/o facial pain and congestion which has worsened over the past 5 days. Pt reports she was seen a week ago for same symptoms and has been using tylenol, cold and flu medicine, mucinex, and alkaseltser plus w/o relief.

## 2023-08-31 NOTE — Discharge Instructions (Signed)
 Your blood pressure was noted to be elevated during your visit today. If you are currently taking medication for high blood pressure, please ensure you are taking this as directed. If you do not have a history of high blood pressure and your blood pressure remains persistently elevated, you may need to begin taking a medication at some point. You may return here within the next few days to recheck if unable to see your primary care provider or if you do not have a one.  BP (!) 167/100 (BP Location: Right Arm)   Pulse 96   Temp 98.3 F (36.8 C) (Oral)   Resp 16   SpO2 96%   BP Readings from Last 3 Encounters:  08/31/23 (!) 167/100  08/24/23 (!) 165/101  01/21/23 (!) 154/102
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
## 2024-02-02 ENCOUNTER — Encounter (HOSPITAL_COMMUNITY): Payer: Self-pay

## 2024-02-02 ENCOUNTER — Ambulatory Visit (HOSPITAL_COMMUNITY)
Admission: EM | Admit: 2024-02-02 | Discharge: 2024-02-02 | Disposition: A | Discharge status: Home / Self Care | Attending: Emergency Medicine | Admitting: Emergency Medicine

## 2024-02-02 ENCOUNTER — Ambulatory Visit (INDEPENDENT_AMBULATORY_CARE_PROVIDER_SITE_OTHER)

## 2024-02-02 DIAGNOSIS — M25511 Pain in right shoulder: Secondary | ICD-10-CM | POA: Diagnosis not present

## 2024-02-02 NOTE — ED Triage Notes (Signed)
 Pt states she fell last night and is c/o right shoulder pain. States she has been taking tylenol  at home with relief.  Full ROM to right arm.

## 2024-02-02 NOTE — Discharge Instructions (Signed)
 Based on my interpretation your x-ray does not reveal any fracture or dislocation. You can continue taking Tylenol  as needed for pain. Alternate between ice and heat and do some gentle stretching to help with pain. You can follow-up with [facility] for further evaluation and management if your pain continues. Otherwise follow-up with your primary care provider or return here as needed.
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
